# Patient Record
Sex: Female | Born: 1960 | Race: White | Hispanic: No | Marital: Single | Smoking: Former smoker
Health system: Southern US, Community
[De-identification: ages and names within clinical notes are randomized; demographics above are authoritative.]

## PROBLEM LIST (undated history)

## (undated) DIAGNOSIS — G43909 Migraine, unspecified, not intractable, without status migrainosus: Secondary | ICD-10-CM

## (undated) DIAGNOSIS — Z9889 Other specified postprocedural states: Secondary | ICD-10-CM

## (undated) DIAGNOSIS — R112 Nausea with vomiting, unspecified: Secondary | ICD-10-CM

## (undated) HISTORY — PX: FRACTURE SURGERY: SHX138

## (undated) HISTORY — PX: CHOLECYSTECTOMY: SHX55

## (undated) HISTORY — PX: APPENDECTOMY: SHX54

## (undated) HISTORY — PX: DIAGNOSTIC LAPAROSCOPY: SUR761

## (undated) HISTORY — DX: Migraine, unspecified, not intractable, without status migrainosus: G43.909

## (undated) HISTORY — PX: MULTIPLE TOOTH EXTRACTIONS: SHX2053

---

## 2008-02-01 ENCOUNTER — Emergency Department (HOSPITAL_COMMUNITY): Admission: EM | Admit: 2008-02-01 | Discharge: 2008-02-01 | Payer: Self-pay | Admitting: Emergency Medicine

## 2009-06-14 ENCOUNTER — Observation Stay (HOSPITAL_COMMUNITY): Admission: EM | Admit: 2009-06-14 | Discharge: 2009-06-15 | Payer: Self-pay | Admitting: Emergency Medicine

## 2010-08-02 LAB — BASIC METABOLIC PANEL
BUN: 11 mg/dL (ref 6–23)
BUN: 11 mg/dL (ref 6–23)
CO2: 28 mEq/L (ref 19–32)
Chloride: 105 mEq/L (ref 96–112)
Chloride: 108 mEq/L (ref 96–112)
GFR calc Af Amer: >60 mL/min (ref >60)
GFR calc non Af Amer: >60 mL/min (ref >60)
Sodium: 140 mEq/L (ref 135–145)

## 2010-08-02 LAB — CBC
HCT: 38.5 % (ref 36.0–46.0)
Hemoglobin: 11.6 g/dL — ABNORMAL LOW (ref 12.0–15.0)
Hemoglobin: 13.3 g/dL (ref 12.0–15.0)
MCHC: 34.5 g/dL (ref 30.0–36.0)
MCV: 93.9 fL (ref 78.0–100.0)
RBC: 3.64 MIL/uL — ABNORMAL LOW (ref 3.87–5.11)
RBC: 4.1 MIL/uL (ref 3.87–5.11)
RDW: 13.6 % (ref 11.5–15.5)
RDW: 14 % (ref 11.5–15.5)
WBC: 9.7 10*3/uL (ref 4.0–10.5)

## 2010-08-02 LAB — LIPASE, BLOOD: Lipase: 51 U/L (ref 11–59)

## 2010-08-02 LAB — DIFFERENTIAL
Basophils Absolute: 0.1 10*3/uL (ref 0.0–0.1)
Basophils Relative: 1 % (ref 0–1)
Eosinophils Absolute: 0.5 10*3/uL (ref 0.0–0.7)
Eosinophils Relative: 5 % (ref 0–5)
Monocytes Absolute: 0.6 10*3/uL (ref 0.1–1.0)

## 2010-08-02 LAB — LIPID PANEL
Cholesterol: 132 mg/dL (ref 0–200)
LDL Cholesterol: 88 mg/dL (ref 0–99)
Triglycerides: 47 mg/dL (ref <150)
VLDL: 9 mg/dL (ref 0–40)

## 2010-08-02 LAB — RAPID URINE DRUG SCREEN, HOSP PERFORMED
Amphetamines: NOT DETECTED
Barbiturates: NOT DETECTED
Benzodiazepines: NOT DETECTED
Tetrahydrocannabinol: NOT DETECTED

## 2010-08-02 LAB — COMPREHENSIVE METABOLIC PANEL
ALT: 11 U/L (ref 0–35)
AST: 20 U/L (ref 0–37)
BUN: 10 mg/dL (ref 6–23)
CO2: 24 mEq/L (ref 19–32)
Potassium: 4.1 mEq/L (ref 3.5–5.1)
Sodium: 138 mEq/L (ref 135–145)
Total Bilirubin: 0.4 mg/dL (ref 0.3–1.2)

## 2010-08-02 LAB — TROPONIN I: Troponin I: 0.03 ng/mL (ref 0.00–0.06)

## 2010-08-02 LAB — CK TOTAL AND CKMB (NOT AT ARMC)
CK, MB: 0.6 ng/mL (ref 0.3–4.0)
Total CK: 37 U/L (ref 7–177)
Total CK: 43 U/L (ref 7–177)

## 2010-08-02 LAB — POCT CARDIAC MARKERS: Troponin i, poc: <0.05 ng/mL (ref 0.00–0.09)

## 2011-03-12 IMAGING — CR DG CHEST 2V
2 series · 2 of 2 positions shown · non-contrast
Comparison: None.

CLINICAL DATA: 48-year-old female with chest pain, left arm and
shoulder pain.

CHEST - 2 VIEW

[w chest pa]
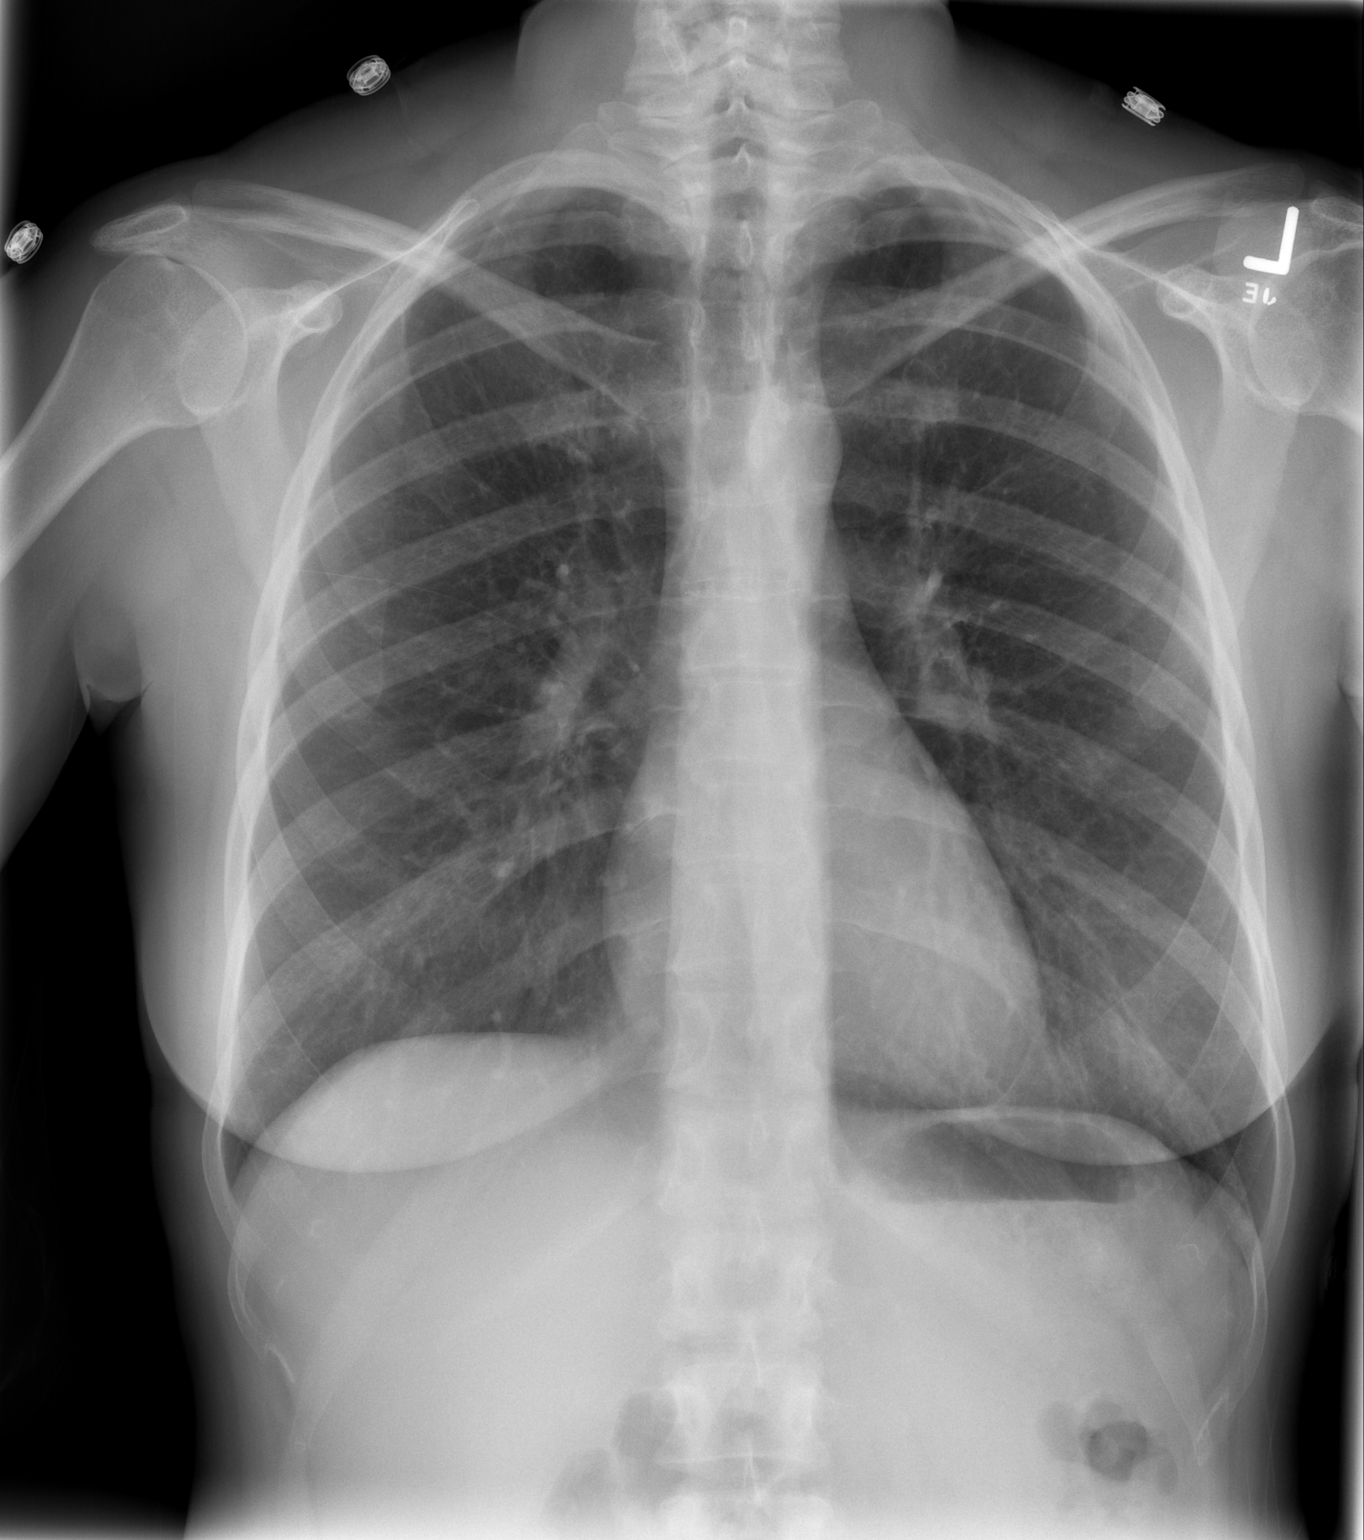

[w chest lat]
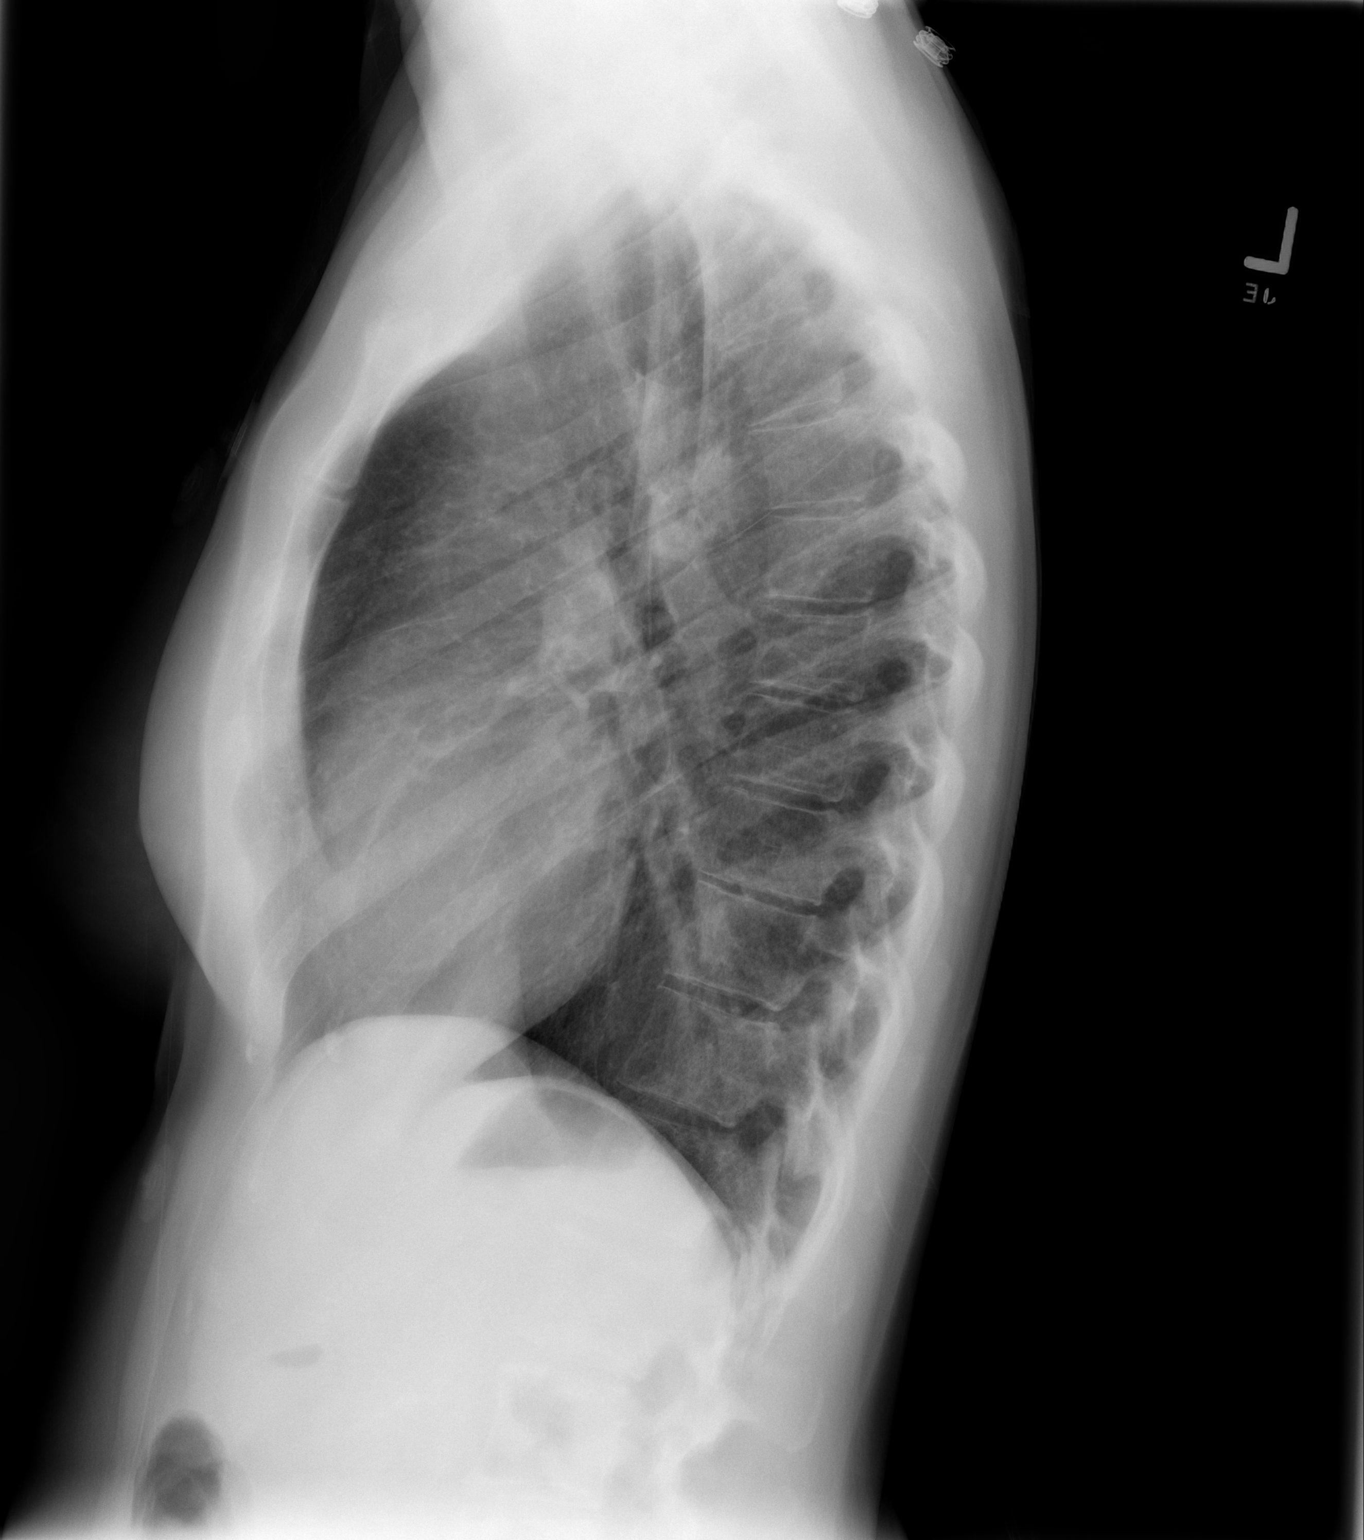

[2 of 2 positions shown; findings below may reference images not displayed]

FINDINGS: Lung volumes within normal limits.  Cardiac size and
mediastinal contours are within normal limits.  Visualized tracheal
air column is within normal limits.  Diffuse increased interstitial
markings, otherwise the lungs are clear with no pneumothorax or
pleural effusion. No acute osseous abnormality identified.
IMPRESSION: No acute cardiopulmonary abnormality.  Increased interstitial
markings probably reflect history of smoking.

## 2011-04-24 ENCOUNTER — Emergency Department (INDEPENDENT_AMBULATORY_CARE_PROVIDER_SITE_OTHER)
Admission: EM | Admit: 2011-04-24 | Discharge: 2011-04-24 | Disposition: A | Discharge status: Home / Self Care | Payer: Self-pay | Source: Home / Self Care | Attending: Family Medicine | Admitting: Family Medicine

## 2011-04-24 ENCOUNTER — Encounter: Payer: Self-pay | Admitting: Emergency Medicine

## 2011-04-24 ENCOUNTER — Emergency Department (INDEPENDENT_AMBULATORY_CARE_PROVIDER_SITE_OTHER): Payer: Self-pay

## 2011-04-24 DIAGNOSIS — S62639A Displaced fracture of distal phalanx of unspecified finger, initial encounter for closed fracture: Secondary | ICD-10-CM

## 2011-04-24 DIAGNOSIS — T07XXXA Unspecified multiple injuries, initial encounter: Secondary | ICD-10-CM

## 2011-04-24 DIAGNOSIS — IMO0002 Reserved for concepts with insufficient information to code with codable children: Secondary | ICD-10-CM

## 2011-04-24 MED ORDER — IBUPROFEN 800 MG PO TABS
ORAL_TABLET | ORAL | Status: AC
  Filled 2011-04-24: qty 1

## 2011-04-24 MED ORDER — IBUPROFEN 800 MG PO TABS
800.0000 mg | ORAL_TABLET | Freq: Once | ORAL | Status: AC
  Administered 2011-04-24: 800 mg via ORAL
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
## 2011-05-29 ENCOUNTER — Telehealth (HOSPITAL_COMMUNITY): Payer: Self-pay | Admitting: *Deleted

## 2011-05-30 ENCOUNTER — Other Ambulatory Visit: Payer: Self-pay | Admitting: Orthopedic Surgery

## 2011-06-02 ENCOUNTER — Encounter (HOSPITAL_BASED_OUTPATIENT_CLINIC_OR_DEPARTMENT_OTHER): Payer: Self-pay | Admitting: *Deleted
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
## 2011-06-03 ENCOUNTER — Encounter (HOSPITAL_BASED_OUTPATIENT_CLINIC_OR_DEPARTMENT_OTHER): Payer: Self-pay | Admitting: *Deleted

## 2011-06-03 ENCOUNTER — Ambulatory Visit (HOSPITAL_BASED_OUTPATIENT_CLINIC_OR_DEPARTMENT_OTHER): Payer: No Typology Code available for payment source | Admitting: *Deleted

## 2011-06-03 ENCOUNTER — Encounter (HOSPITAL_BASED_OUTPATIENT_CLINIC_OR_DEPARTMENT_OTHER)
Admission: RE | Disposition: A | Discharge status: Home / Self Care | Payer: Self-pay | Source: Ambulatory Visit | Attending: Orthopedic Surgery

## 2011-06-03 ENCOUNTER — Ambulatory Visit (HOSPITAL_BASED_OUTPATIENT_CLINIC_OR_DEPARTMENT_OTHER)
Admission: RE | Admit: 2011-06-03 | Discharge: 2011-06-03 | Disposition: A | Discharge status: Home / Self Care | Payer: No Typology Code available for payment source | Source: Ambulatory Visit | Attending: Orthopedic Surgery | Admitting: Orthopedic Surgery

## 2011-06-03 DIAGNOSIS — X500XXA Overexertion from strenuous movement or load, initial encounter: Secondary | ICD-10-CM | POA: Insufficient documentation

## 2011-06-03 DIAGNOSIS — S63639A Sprain of interphalangeal joint of unspecified finger, initial encounter: Secondary | ICD-10-CM | POA: Insufficient documentation

## 2011-06-03 HISTORY — DX: Other specified postprocedural states: R11.2

## 2011-06-03 HISTORY — PX: TENDON REPAIR: SHX5111

## 2011-06-03 HISTORY — DX: Other specified postprocedural states: Z98.890

## 2011-06-03 LAB — POCT HEMOGLOBIN-HEMACUE: Hemoglobin: 13.6 g/dL (ref 12.0–15.0)

## 2011-06-03 SURGERY — TENDON REPAIR
Anesthesia: General | Site: Finger | Laterality: Right | Wound class: Clean

## 2011-06-03 MED ORDER — HYDROMORPHONE HCL PF 1 MG/ML IJ SOLN
0.2500 mg | INTRAMUSCULAR | Status: DC | PRN
  Administered 2011-06-03: 0.25 mg via INTRAVENOUS

## 2011-06-03 MED ORDER — PROPOFOL 10 MG/ML IV EMUL
INTRAVENOUS | Status: DC | PRN
  Administered 2011-06-03: 200 mg via INTRAVENOUS

## 2011-06-03 MED ORDER — FENTANYL CITRATE 0.05 MG/ML IJ SOLN
INTRAMUSCULAR | Status: DC | PRN
  Administered 2011-06-03 (×2): 25 ug via INTRAVENOUS
  Administered 2011-06-03: 100 ug via INTRAVENOUS
  Administered 2011-06-03: 25 ug via INTRAVENOUS

## 2011-06-03 MED ORDER — MIDAZOLAM HCL 5 MG/5ML IJ SOLN
INTRAMUSCULAR | Status: DC | PRN
  Administered 2011-06-03: 2 mg via INTRAVENOUS

## 2011-06-03 MED ORDER — LACTATED RINGERS IV SOLN
INTRAVENOUS | Status: DC
Start: 2011-06-03 — End: 2011-06-03
  Administered 2011-06-03 (×3): via INTRAVENOUS

## 2011-06-03 MED ORDER — ONDANSETRON HCL 4 MG/2ML IJ SOLN
INTRAMUSCULAR | Status: DC | PRN
  Administered 2011-06-03: 4 mg via INTRAVENOUS

## 2011-06-03 MED ORDER — ONDANSETRON HCL 4 MG/2ML IJ SOLN
4.0000 mg | Freq: Four times a day (QID) | INTRAMUSCULAR | Status: AC | PRN
  Administered 2011-06-03: 4 mg via INTRAVENOUS

## 2011-06-03 MED ORDER — PROMETHAZINE HCL 25 MG/ML IJ SOLN
6.2500 mg | INTRAMUSCULAR | Status: DC | PRN
  Administered 2011-06-03: 6.25 mg via INTRAVENOUS

## 2011-06-03 MED ORDER — OXYCODONE-ACETAMINOPHEN 5-325 MG PO TABS: ORAL_TABLET | ORAL | Status: AC

## 2011-06-03 MED ORDER — LIDOCAINE HCL (CARDIAC) 20 MG/ML IV SOLN
INTRAVENOUS | Status: DC | PRN
  Administered 2011-06-03: 60 mg via INTRAVENOUS

## 2011-06-03 MED ORDER — DEXAMETHASONE SODIUM PHOSPHATE 10 MG/ML IJ SOLN
INTRAMUSCULAR | Status: DC | PRN
  Administered 2011-06-03: 10 mg via INTRAVENOUS

## 2011-06-03 MED ORDER — BUPIVACAINE HCL (PF) 0.25 % IJ SOLN
INTRAMUSCULAR | Status: DC | PRN
  Administered 2011-06-03: 8 mL

## 2011-06-03 SURGICAL SUPPLY — 81 items
BAG DECANTER FOR FLEXI CONT (MISCELLANEOUS) IMPLANT
BANDAGE CONFORM 2  STR LF (GAUZE/BANDAGES/DRESSINGS) IMPLANT
BANDAGE ELASTIC 3 VELCRO ST LF (GAUZE/BANDAGES/DRESSINGS) ×2 IMPLANT
BANDAGE GAUZE ELAST BULKY 4 IN (GAUZE/BANDAGES/DRESSINGS) ×2 IMPLANT
BLADE MINI RND TIP GREEN BEAV (BLADE) IMPLANT
BLADE SURG 15 STRL LF DISP TIS (BLADE) ×2 IMPLANT
BLADE SURG 15 STRL SS (BLADE) ×2
BNDG ELASTIC 2 VLCR STRL LF (GAUZE/BANDAGES/DRESSINGS) IMPLANT
BNDG ESMARK 4X9 LF (GAUZE/BANDAGES/DRESSINGS) ×2 IMPLANT
CHLORAPREP W/TINT 26ML (MISCELLANEOUS) ×2 IMPLANT
CLOTH BEACON ORANGE TIMEOUT ST (SAFETY) ×2 IMPLANT
CORDS BIPOLAR (ELECTRODE) ×2 IMPLANT
COTTONBALL LRG STERILE PKG (GAUZE/BANDAGES/DRESSINGS) IMPLANT
COVER MAYO STAND STRL (DRAPES) ×2 IMPLANT
COVER TABLE BACK 60X90 (DRAPES) ×2 IMPLANT
CUFF TOURNIQUET SINGLE 18IN (TOURNIQUET CUFF) ×2 IMPLANT
DECANTER SPIKE VIAL GLASS SM (MISCELLANEOUS) IMPLANT
DRAIN TLS ROUND 10FR (DRAIN) IMPLANT
DRAPE EXTREMITY T 121X128X90 (DRAPE) ×2 IMPLANT
DRAPE OEC MINIVIEW 54X84 (DRAPES) IMPLANT
DRAPE SURG 17X23 STRL (DRAPES) ×2 IMPLANT
DRSG PAD ABDOMINAL 8X10 ST (GAUZE/BANDAGES/DRESSINGS) IMPLANT
GAUZE SPONGE 4X4 16PLY XRAY LF (GAUZE/BANDAGES/DRESSINGS) IMPLANT
GAUZE XEROFORM 1X8 LF (GAUZE/BANDAGES/DRESSINGS) ×2 IMPLANT
GLOVE BIO SURGEON STRL SZ7.5 (GLOVE) ×2 IMPLANT
GLOVE BIOGEL PI IND STRL 8 (GLOVE) ×2 IMPLANT
GLOVE BIOGEL PI INDICATOR 8 (GLOVE) ×2
GLOVE SURG ORTHO 8.0 STRL STRW (GLOVE) ×2 IMPLANT
GOWN PREVENTION PLUS XLARGE (GOWN DISPOSABLE) ×2 IMPLANT
GOWN PREVENTION PLUS XXLARGE (GOWN DISPOSABLE) IMPLANT
GOWN STRL REIN XL XLG (GOWN DISPOSABLE) ×4 IMPLANT
KWIRE 4.0 X .035IN (WIRE) IMPLANT
LOOP VESSEL MAXI BLUE (MISCELLANEOUS) IMPLANT
NEEDLE HYPO 22GX1.5 SAFETY (NEEDLE) IMPLANT
NEEDLE HYPO 25X1 1.5 SAFETY (NEEDLE) ×4 IMPLANT
NEEDLE KEITH (NEEDLE) IMPLANT
NS IRRIG 1000ML POUR BTL (IV SOLUTION) ×2 IMPLANT
PACK BASIN DAY SURGERY FS (CUSTOM PROCEDURE TRAY) ×2 IMPLANT
PAD CAST 3X4 CTTN HI CHSV (CAST SUPPLIES) ×1 IMPLANT
PAD CAST 4YDX4 CTTN HI CHSV (CAST SUPPLIES) IMPLANT
PADDING CAST ABS 3INX4YD NS (CAST SUPPLIES)
PADDING CAST ABS 4INX4YD NS (CAST SUPPLIES) ×1
PADDING CAST ABS COTTON 3X4 (CAST SUPPLIES) IMPLANT
PADDING CAST ABS COTTON 4X4 ST (CAST SUPPLIES) ×1 IMPLANT
PADDING CAST COTTON 3X4 STRL (CAST SUPPLIES) ×1
PADDING CAST COTTON 4X4 STRL (CAST SUPPLIES)
SLEEVE SCD COMPRESS KNEE MED (MISCELLANEOUS) ×2 IMPLANT
SPLINT PLASTER CAST XFAST 3X15 (CAST SUPPLIES) ×10 IMPLANT
SPLINT PLASTER CAST XFAST 4X15 (CAST SUPPLIES) IMPLANT
SPLINT PLASTER XTRA FAST SET 4 (CAST SUPPLIES)
SPLINT PLASTER XTRA FASTSET 3X (CAST SUPPLIES) ×10
SPONGE GAUZE 4X4 12PLY (GAUZE/BANDAGES/DRESSINGS) ×2 IMPLANT
STOCKINETTE 4X48 STRL (DRAPES) ×2 IMPLANT
SUT CHROMIC 5 0 P 3 (SUTURE) IMPLANT
SUT ETHIBOND 3-0 V-5 (SUTURE) IMPLANT
SUT ETHILON 3 0 PS 1 (SUTURE) IMPLANT
SUT ETHILON 4 0 PS 2 18 (SUTURE) ×4 IMPLANT
SUT FIBERWIRE 3-0 18 TAPR NDL (SUTURE)
SUT FIBERWIRE 4-0 18 DIAM BLUE (SUTURE)
SUT MERSILENE 2.0 SH NDLE (SUTURE) IMPLANT
SUT MERSILENE 3 0 FS 1 (SUTURE) IMPLANT
SUT MERSILENE 4 0 P 3 (SUTURE) IMPLANT
SUT POLY BUTTON 15MM (SUTURE) ×2 IMPLANT
SUT PROLENE 2 0 SH DA (SUTURE) ×2 IMPLANT
SUT PROLENE 6 0 P 1 18 (SUTURE) IMPLANT
SUT SILK 2 0 FS (SUTURE) ×2 IMPLANT
SUT SILK 4 0 PS 2 (SUTURE) IMPLANT
SUT STEEL 4 0 V 26 (SUTURE) IMPLANT
SUT VIC AB 3-0 PS1 18 (SUTURE)
SUT VIC AB 3-0 PS1 18XBRD (SUTURE) IMPLANT
SUT VIC AB 4-0 P-3 18XBRD (SUTURE) IMPLANT
SUT VIC AB 4-0 P3 18 (SUTURE)
SUT VICRYL 4-0 PS2 18IN ABS (SUTURE) IMPLANT
SUTURE FIBERWR 3-0 18 TAPR NDL (SUTURE) IMPLANT
SUTURE FIBERWR 4-0 18 DIA BLUE (SUTURE) IMPLANT
SYR BULB 3OZ (MISCELLANEOUS) ×2 IMPLANT
SYR CONTROL 10ML LL (SYRINGE) ×2 IMPLANT
TOWEL OR 17X24 6PK STRL BLUE (TOWEL DISPOSABLE) ×4 IMPLANT
TUBE FEEDING 5FR 15 INCH (TUBING) ×2 IMPLANT
UNDERPAD 30X30 INCONTINENT (UNDERPADS AND DIAPERS) ×2 IMPLANT
WATER STERILE IRR 1000ML POUR (IV SOLUTION) IMPLANT

## 2011-06-03 NOTE — Anesthesia Postprocedure Evaluation (Signed)
Anesthesia Post Note  Patient: Megan Li  Procedure(s) Performed:  TENDON REPAIR - right small flexor tendon repair  Anesthesia type: General  Patient location: PACU  Post pain: Pain level controlled and Adequate analgesia  Post assessment: Post-op Vital signs reviewed, Patient's Cardiovascular Status Stable, Respiratory Function Stable, Patent Airway and Pain level controlled  Last Vitals:  Filed Vitals:   06/03/11 1450  BP:   Pulse: 85  Temp:   Resp: 11    Post vital signs: Reviewed and stable  Level of consciousness: awake, alert  and oriented  Complications: No apparent anesthesia complications

## 2011-06-03 NOTE — Op Note (Signed)
Dictation [phone]

## 2011-06-03 NOTE — Anesthesia Preprocedure Evaluation (Signed)
Anesthesia Evaluation  Patient identified by MRN, date of birth, ID band Patient awake    Reviewed: Allergy & Precautions, H&P , NPO status , Patient's Chart, lab work & pertinent test results  History of Anesthesia Complications (+) PONV  Airway Mallampati: II  Neck ROM: full    Dental   Pulmonary          Cardiovascular     Neuro/Psych    GI/Hepatic   Endo/Other    Renal/GU      Musculoskeletal   Abdominal   Peds  Hematology   Anesthesia Other Findings   Reproductive/Obstetrics                           Anesthesia Physical Anesthesia Plan  ASA: II  Anesthesia Plan: General   Post-op Pain Management:    Induction: Intravenous  Airway Management Planned: LMA  Additional Equipment:   Intra-op Plan:   Post-operative Plan:   Informed Consent: I have reviewed the patients History and Physical, chart, labs and discussed the procedure including the risks, benefits and alternatives for the proposed anesthesia with the patient or authorized representative who has indicated his/her understanding and acceptance.     Plan Discussed with: CRNA and Surgeon  Anesthesia Plan Comments:         Anesthesia Quick Evaluation

## 2011-06-03 NOTE — Transfer of Care (Signed)
Immediate Anesthesia Transfer of Care Note  Patient: Megan Li  Procedure(s) Performed:  TENDON REPAIR - right small flexor tendon repair  Patient Location: PACU  Anesthesia Type: General  Level of Consciousness: awake, alert , oriented and patient cooperative  Airway & Oxygen Therapy: Patient Spontanous Breathing and Patient connected to face mask oxygen  Post-op Assessment: Report given to PACU RN and Post -op Vital signs reviewed and stable  Post vital signs: Reviewed and stable  Complications: No apparent anesthesia complications

## 2011-06-03 NOTE — Anesthesia Procedure Notes (Signed)
Procedure Name: LMA Insertion Date/Time: 06/03/2011 1:45 PM Performed by: Matilde Pottenger D Pre-anesthesia Checklist: Patient identified, Emergency Drugs available, Suction available and Patient being monitored Patient Re-evaluated:Patient Re-evaluated prior to inductionOxygen Delivery Method: Circle System Utilized Preoxygenation: Pre-oxygenation with 100% oxygen Intubation Type: IV induction Ventilation: Mask ventilation without difficulty LMA: LMA inserted LMA Size: 4.0 Number of attempts: 1 Placement Confirmation: positive ETCO2 Tube secured with: Tape Dental Injury: Teeth and Oropharynx as per pre-operative assessment

## 2011-06-03 NOTE — Progress Notes (Signed)
Reported to L. Glendell Docker, RN as caregiver
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
## 2011-06-03 NOTE — Brief Op Note (Signed)
06/03/2011  2:45 PM  PATIENT:  Megan Li  51 y.o. female  PRE-OPERATIVE DIAGNOSIS:  right small flexor tendon avulsion  POST-OPERATIVE DIAGNOSIS:  right small flexor tendon avulsion  PROCEDURE:  Procedure(s): TENDON REPAIR  SURGEON:  Surgeon(s): Tami Ribas, MD Nicki Reaper, MD  PHYSICIAN ASSISTANT:   ASSISTANTS: none   ANESTHESIA:   general  EBL:  Total I/O In: 1600 [I.V.:1600] Out: -   BLOOD ADMINISTERED:none  DRAINS: none   LOCAL MEDICATIONS USED:  MARCAINE 8 CC  SPECIMEN:  No Specimen  DISPOSITION OF SPECIMEN:  N/A  COUNTS:  YES  TOURNIQUET:   Total Tourniquet Time Documented: Upper Arm (Right) - 45 minutes  DICTATION: .Other Dictation: Dictation Number [id number]  PLAN OF CARE: Discharge to home after PACU  PATIENT DISPOSITION:  PACU - hemodynamically stable.

## 2011-06-04 ENCOUNTER — Encounter (HOSPITAL_BASED_OUTPATIENT_CLINIC_OR_DEPARTMENT_OTHER): Payer: Self-pay | Admitting: Orthopedic Surgery
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
## 2011-08-01 ENCOUNTER — Other Ambulatory Visit: Payer: Self-pay | Admitting: Obstetrics and Gynecology

## 2011-08-01 DIAGNOSIS — Z1231 Encounter for screening mammogram for malignant neoplasm of breast: Secondary | ICD-10-CM

## 2011-08-12 ENCOUNTER — Ambulatory Visit (INDEPENDENT_AMBULATORY_CARE_PROVIDER_SITE_OTHER): Payer: No Typology Code available for payment source | Admitting: *Deleted

## 2011-08-12 ENCOUNTER — Ambulatory Visit (HOSPITAL_COMMUNITY)
Admission: RE | Admit: 2011-08-12 | Discharge: 2011-08-12 | Disposition: A | Discharge status: Home / Self Care | Payer: Self-pay | Source: Ambulatory Visit | Attending: Obstetrics and Gynecology | Admitting: Obstetrics and Gynecology

## 2011-08-12 VITALS — BP 134/87 | HR 76 | Temp 98.0°F | Ht 65.0 in | Wt 164.1 lb

## 2011-08-12 DIAGNOSIS — Z1231 Encounter for screening mammogram for malignant neoplasm of breast: Secondary | ICD-10-CM

## 2011-08-12 DIAGNOSIS — Z1239 Encounter for other screening for malignant neoplasm of breast: Secondary | ICD-10-CM

## 2011-08-12 NOTE — Patient Instructions (Signed)
Taught patient how to perform BSE. Patient did not need a Pap smear today due to last Pap smear was 07/07/11. Let her know [facility] will cover Pap smears every 3 years unless has a history of abnormal Pap smears. Patient is escorted to mammography for a screening mammogram. Let patient know will follow up with her within the next couple weeks with results. Patient verbalized understanding.

## 2011-08-12 NOTE — Progress Notes (Signed)
Complaints of white pimple on right nipple and occasional milky discharge from bilateral breasts last noticed 7 months ago.  Pap Smear:    Pap smear not performed today. Patients last Pap smear was 07/07/11 at the free Pap smear screening at the [facility] and awaiting results. Per patient no history of abnormal Pap smears. No Pap smear results in EPIC.  Physical exam: Breasts Breasts symmetrical. No skin abnormalities left breast. Small white lesion on right nipple that has a pimple appearance. No redness observed around white lesion. No nipple retraction bilateral breasts. Patient complained of milky discharge of bilateral breasts that happens when aroused. Last time patient observed any nipple discharge was 7 months ago. No discharge excreted when checked for nipple discharge. Patient attempted to squeeze nipple discharge to get sample for cytology and unable to get any discharge. No lymphadenopathy. No lumps palpated bilateral breasts. No complaints of pain or tenderness on exam.         Pelvic/Bimanual No Pap smear completed today since last Pap smear was 07/07/11. Pap smear not indicated per [facility] guidelines.

## 2011-09-01 ENCOUNTER — Encounter: Payer: Self-pay | Admitting: Obstetrics and Gynecology

## 2011-09-17 ENCOUNTER — Encounter: Payer: Self-pay | Admitting: Obstetrics and Gynecology

## 2012-10-11 ENCOUNTER — Other Ambulatory Visit: Payer: Self-pay | Admitting: Obstetrics and Gynecology

## 2012-10-11 DIAGNOSIS — Z1231 Encounter for screening mammogram for malignant neoplasm of breast: Secondary | ICD-10-CM

## 2012-10-26 ENCOUNTER — Ambulatory Visit (HOSPITAL_COMMUNITY): Payer: Self-pay

## 2013-01-20 IMAGING — CR DG HAND COMPLETE 3+V*R*
3 series · 3 of 3 positions shown · non-contrast
Comparison: None.

CLINICAL DATA: Pain after fall

RIGHT HAND - COMPLETE 3+ VIEW

[view not recorded (1 of 3)]
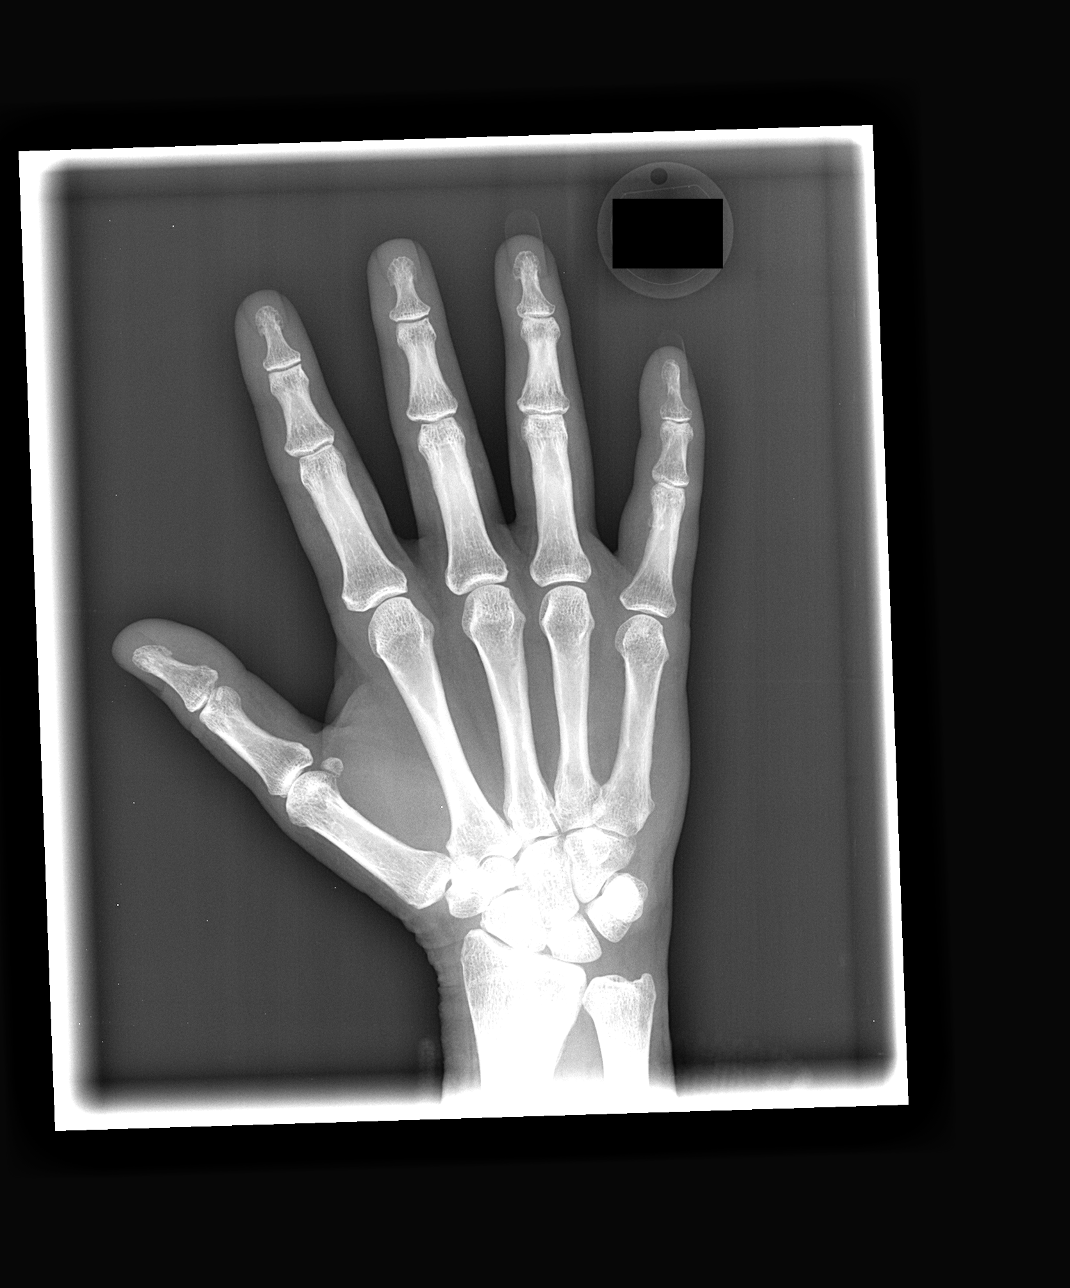

[view not recorded (2 of 3)]
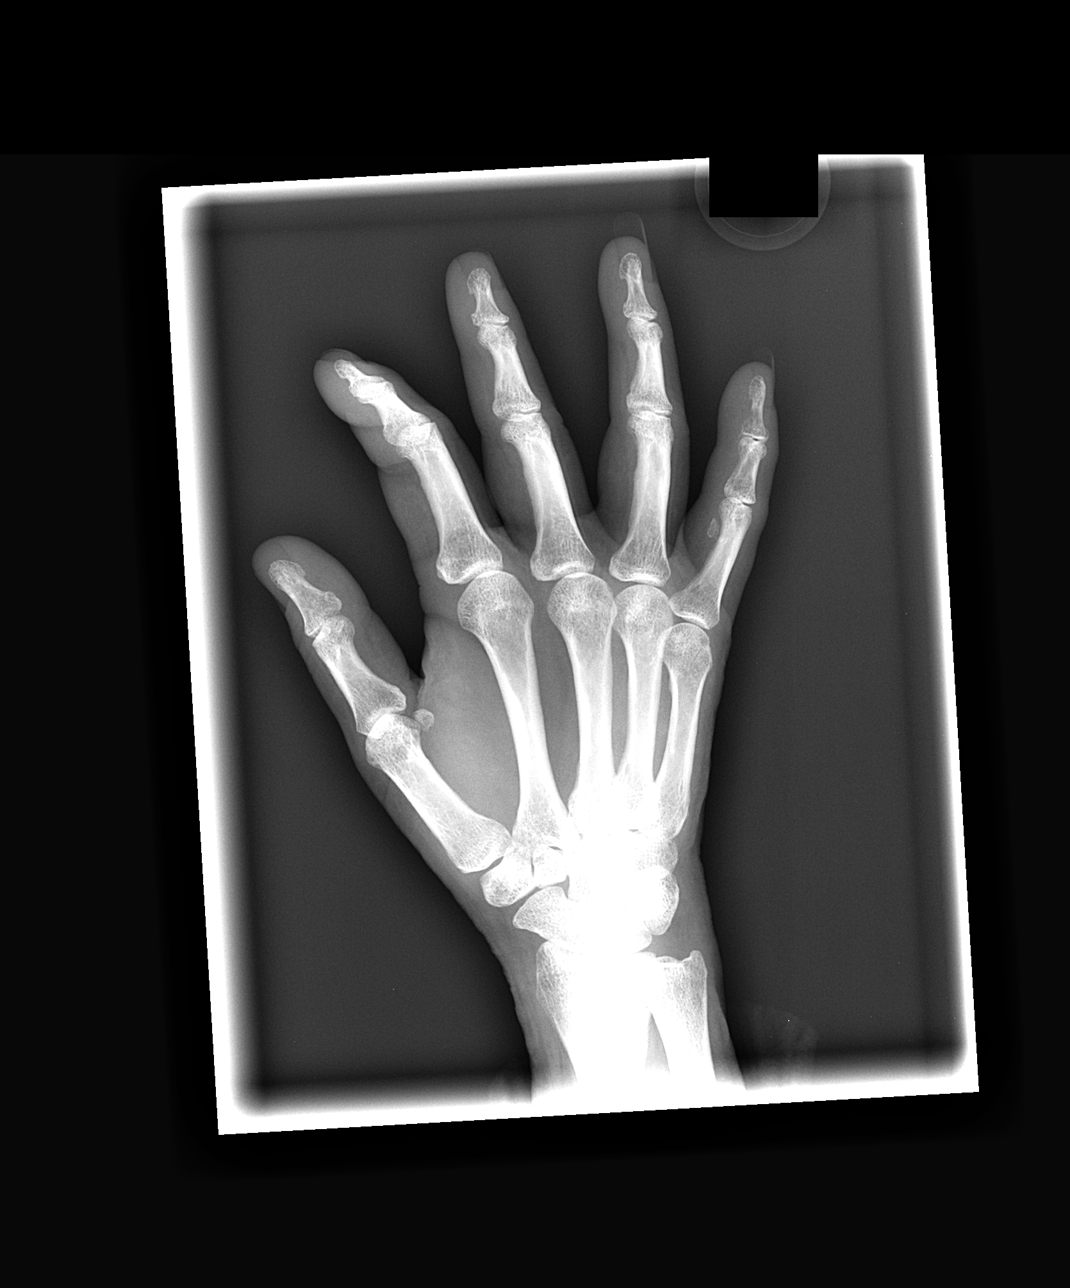

[view not recorded (3 of 3)]
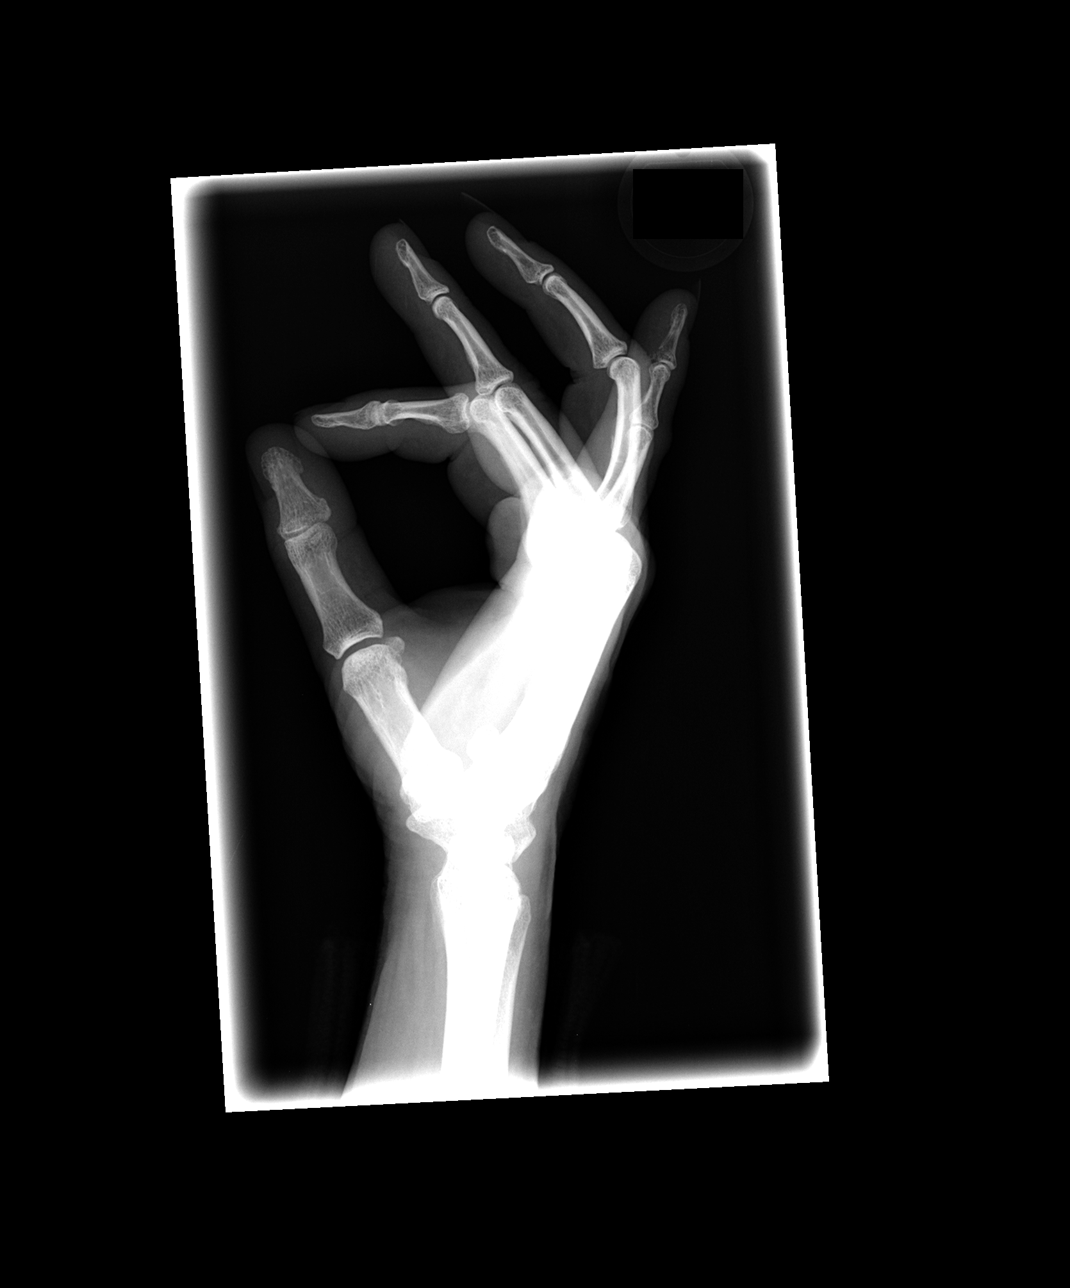

[3 of 3 positions shown; findings below may reference images not displayed]

FINDINGS: There is a fracture at the palmar base of the fifth
distal phalanx which extends into the DIP joint.  This is seen on
the lateral film only.

There is a density adjacent to the distal shaft of the fifth
proximal phalanx which is well corticated and felt to be an ossicle
or an old avulsion injury; however, please correlate clinically
regarding an acute fracture in this location.

There is soft tissue swelling overlying the fifth finger.
IMPRESSION: Small nondisplaced fracture at the base of the fifth distal
phalanx.

Probable ossicle or old avulsion injury adjacent to the distal
shaft of the fifth proximal phalanx.

## 2015-01-15 ENCOUNTER — Encounter (HOSPITAL_COMMUNITY): Payer: Self-pay | Admitting: Emergency Medicine

## 2015-01-15 ENCOUNTER — Emergency Department (INDEPENDENT_AMBULATORY_CARE_PROVIDER_SITE_OTHER)
Admission: EM | Admit: 2015-01-15 | Discharge: 2015-01-15 | Disposition: A | Discharge status: Home / Self Care | Payer: 59 | Source: Home / Self Care | Attending: Family Medicine | Admitting: Family Medicine

## 2015-01-15 DIAGNOSIS — J029 Acute pharyngitis, unspecified: Secondary | ICD-10-CM

## 2015-01-15 DIAGNOSIS — J0100 Acute maxillary sinusitis, unspecified: Secondary | ICD-10-CM

## 2015-01-15 DIAGNOSIS — R51 Headache: Secondary | ICD-10-CM | POA: Diagnosis not present

## 2015-01-15 DIAGNOSIS — H1013 Acute atopic conjunctivitis, bilateral: Secondary | ICD-10-CM | POA: Diagnosis not present

## 2015-01-15 DIAGNOSIS — R519 Headache, unspecified: Secondary | ICD-10-CM

## 2015-01-15 DIAGNOSIS — R0982 Postnasal drip: Secondary | ICD-10-CM

## 2015-01-15 MED ORDER — PREDNISONE 20 MG PO TABS: ORAL_TABLET | ORAL | Status: AC

## 2015-01-15 NOTE — ED Notes (Signed)
Pt states she was feeling a lot of congestion in her sinuses and so she was directed to take Sudafed her pharmacist.  Pt states that since then, she has started feeling a lot of pressure in her face and has been suffering from facial swelling and numbness all over.  She also reports fullness in her ears.  She denies any fever at home.

## 2015-01-15 NOTE — ED Provider Notes (Signed)
CSN: [id number]     Arrival date & time 01/15/15  1414 History   First MD Initiated Contact with Patient 01/15/15 1450     Chief Complaint  Patient presents with  . Sinusitis   (Consider location/radiation/quality/duration/timing/severity/associated sxs/prior Treatment) HPI Comments: 54 year old female complaining of sinus congestion for 3-4 days. She went to the drugstore and the pharmacist had recommended Sudafed. She is taking that with good relief of nasal congestion. Denies rhinorrhea. The initial onset of symptoms including PND and hoarseness. She is now complaining of facial swelling that began this morning. She awoke with her the orbital swelling as well as swelling over the forehead and anterior face. She is complaining of headache and facial pain described as pressure and numbing feeling. She has no known history of sinusitis. Denies fevers. Denies chills. Denies sore throat. Cough is rare.  Patient is a 54 y.o. female presenting with URI.  URI Presenting symptoms: congestion and facial pain   Presenting symptoms: no cough, no ear pain, no fatigue, no fever, no rhinorrhea and no sore throat   Severity:  Moderate Onset quality:  Sudden Duration:  4 days Timing:  Constant Progression:  Worsening Chronicity:  New Relieved by:  OTC medications Worsened by:  Certain positions Associated symptoms: headaches and sinus pain   Associated symptoms: no arthralgias, no myalgias, no neck pain, no sneezing, no swollen glands and no wheezing   Risk factors: no chronic cardiac disease and no diabetes mellitus     Past Medical History  Diagnosis Date  . PONV (postoperative nausea and vomiting)   . Migraines    Past Surgical History  Procedure Laterality Date  . Cholecystectomy    . Appendectomy    . Fracture surgery      rt lower ankle-  . Diagnostic laparoscopy      abd-lymph nodes and tumor  . Cesarean section    . Multiple tooth extractions    . Tendon repair  06/03/2011     Procedure: TENDON REPAIR;  Surgeon: Tami Ribas, MD;  Location: [facility];  Service: Orthopedics;  Laterality: Right;  right small flexor tendon repair   Family History  Problem Relation Age of Onset  . Hypertension Mother   . Hypertension Sister   . Breast cancer Sister   . Cancer Sister     brain   Social History  Substance Use Topics  . Smoking status: Former Smoker    Quit date: 06/01/2010  . Smokeless tobacco: None  . Alcohol Use: No   OB History    Gravida Para Term Preterm AB TAB SAB Ectopic Multiple Living   8    7 1 6   2      Review of Systems  Constitutional: Positive for activity change. Negative for fever, chills, appetite change and fatigue.  HENT: Positive for congestion, facial swelling and postnasal drip. Negative for ear pain, rhinorrhea, sneezing and sore throat.   Eyes:       Swelling around the eyes.  Respiratory: Negative.  Negative for cough, chest tightness and wheezing.   Cardiovascular: Negative.   Gastrointestinal: Negative.   Musculoskeletal: Negative for myalgias, arthralgias, neck pain and neck stiffness.  Skin: Negative for pallor and rash.  Neurological: Positive for headaches. Negative for dizziness.  Psychiatric/Behavioral: Negative for behavioral problems and agitation.  All other systems reviewed and are negative.   Allergies  Morphine and related  Home Medications   Prior to Admission medications   Medication Sig Start Date End Date  Taking? Authorizing Provider  pseudoephedrine (SUDAFED) 30 MG tablet Take 30 mg by mouth every 4 (four) hours as needed for congestion.   Yes Historical Provider, MD  aspirin-acetaminophen-caffeine (EXCEDRIN MIGRAINE) [phone] MG per tablet Take 1 tablet by mouth every 6 (six) hours as needed.    Historical Provider, MD  lansoprazole (PREVACID) 15 MG capsule Take 15 mg by mouth daily.    Historical Provider, MD  NON FORMULARY otc migraine med    Historical Provider, MD   oxyCODONE-acetaminophen (PERCOCET) 5-325 MG per tablet Take 1 tablet by mouth every 4 (four) hours as needed.    Historical Provider, MD  predniSONE (DELTASONE) 20 MG tablet Take 3 tabs po on first day, 2 tabs second day, 2 tabs third day, 1 tab fourth day, 1 tab 5th day. Take with food. 01/15/15   Hayden Rasmussen, NP   Meds Ordered and Administered this Visit  Medications - No data to display  BP 148/85 mmHg  Pulse 73  Temp(Src) 98.4 F (36.9 C) (Oral)  SpO2 98% No data found.   Physical Exam  Constitutional: She is oriented to person, place, and time. She appears well-developed and well-nourished. No distress.  HENT:  Head: Normocephalic and atraumatic.  Bilateral TMs are mildly retracted. Oropharynx with minor erythema and much cobblestoning. Positive for clear PND. Positive for facial puffiness/swelling. Tenderness over the paranasal, maxillary and forehead sinuses There is also tenderness over the temple musculature and parietal musculature.  Eyes: Conjunctivae and EOM are normal.  Neck: Normal range of motion. Neck supple.  Cardiovascular: Normal rate, regular rhythm and normal heart sounds.   Pulmonary/Chest: Effort normal and breath sounds normal. No respiratory distress. She has no wheezes. She has no rales.  Abdominal: Soft. There is no tenderness.  Musculoskeletal: Normal range of motion. She exhibits no edema.  Lymphadenopathy:    She has no cervical adenopathy.  Neurological: She is alert and oriented to person, place, and time.  Skin: Skin is warm and dry. No rash noted.  Psychiatric: She has a normal mood and affect.  Nursing note and vitals reviewed.   ED Course  Procedures (including critical care time)  Labs Review Labs Reviewed - No data to display  Imaging Review No results found.   Visual Acuity Review  Right Eye Distance:   Left Eye Distance:   Bilateral Distance:    Right Eye Near:   Left Eye Near:    Bilateral Near:         MDM   1.  Acute maxillary sinusitis, recurrence not specified   2. Facial pain   3. Allergic conjunctivitis, bilateral   4. Allergic pharyngitis   5. PND (post-nasal drip)    COntinue the Sudafed as directed Lots of Saline nasal spray Claritin or Allegra for drainage and allergies Prednisone as directed, take with food Warm compresses to eyes zaditor eye drops as directed Lots of fluids  Tylenol prn   Hayden Rasmussen, NP 01/15/15 1547

## 2015-01-15 NOTE — Discharge Instructions (Signed)
Allergic Conjunctivitis Zaditor eye drops 1 drop each eye twice a day A thin membrane (conjunctiva) covers the eyeball and underside of the eyelids. Allergic conjunctivitis happens when the thin membrane gets irritated from things like animal dander, pollen, perfumes, or smoke (allergens). The membrane may become puffy (swollen) and red. Small bumps may form on the inside of the eyelids. Your eyes may get teary, itchy, or burn. It cannot be passed to another person (contagious).  HOME CARE  Wash your hands before and after applying medicated drops or creams.  Do not touch the drop or cream tube to your eye or eyelids.  Do not use your soft contacts. Throw them away. Use a new pair once recovery is complete.  Do not use your hard contacts. They need to be washed (sterilized) thoroughly after recovery is complete.  Put a cold cloth to your eye(s) if you have itching and burning. GET HELP RIGHT AWAY IF:   You are not feeling better in 2 to 3 days after treatment.  Your lids are sticky or stick together.  Fluid comes from the eye(s).  You become sensitive to light.  You have a temperature by mouth above 102 F (38.9 C).  You have pain in and around the eye(s).  You start to have vision problems. MAKE SURE YOU:   Understand these instructions.  Will watch your condition.  Will get help right away if you are not doing well or get worse. Document Released: 10/16/2009 Document Revised: 07/21/2011 Document Reviewed: 10/16/2009 [facility] Patient Information 2015 [place], [place]. This information is not intended to replace advice given to you by your health care provider. Make sure you discuss any questions you have with your health care provider.  Sinusitis COntinue the Sudafed as directed Lots of Saline nasal spray Claritin or Allegra for drainage and allergies Prednisone as directed, take with food Warm compresses to eyes  Sinusitis is redness, soreness, and inflammation of the  paranasal sinuses. Paranasal sinuses are air pockets within the bones of your face (beneath the eyes, the middle of the forehead, or above the eyes). In healthy paranasal sinuses, mucus is able to drain out, and air is able to circulate through them by way of your nose. However, when your paranasal sinuses are inflamed, mucus and air can become trapped. This can allow bacteria and other germs to grow and cause infection. Sinusitis can develop quickly and last only a short time (acute) or continue over a long period (chronic). Sinusitis that lasts for more than 12 weeks is considered chronic.  CAUSES  Causes of sinusitis include:  Allergies.  Structural abnormalities, such as displacement of the cartilage that separates your nostrils (deviated septum), which can decrease the air flow through your nose and sinuses and affect sinus drainage.  Functional abnormalities, such as when the small hairs (cilia) that line your sinuses and help remove mucus do not work properly or are not present. SIGNS AND SYMPTOMS  Symptoms of acute and chronic sinusitis are the same. The primary symptoms are pain and pressure around the affected sinuses. Other symptoms include:  Upper toothache.  Earache.  Headache.  Bad breath.  Decreased sense of smell and taste.  A cough, which worsens when you are lying flat.  Fatigue.  Fever.  Thick drainage from your nose, which often is green and may contain pus (purulent).  Swelling and warmth over the affected sinuses. DIAGNOSIS  Your health care provider will perform a physical exam. During the exam, your health care provider  may:  Look in your nose for signs of abnormal growths in your nostrils (nasal polyps).  Tap over the affected sinus to check for signs of infection.  View the inside of your sinuses (endoscopy) using an imaging device that has a light attached (endoscope). If your health care provider suspects that you have chronic sinusitis, one or more  of the following tests may be recommended:  Allergy tests.  Nasal culture. A sample of mucus is taken from your nose, sent to a lab, and screened for bacteria.  Nasal cytology. A sample of mucus is taken from your nose and examined by your health care provider to determine if your sinusitis is related to an allergy. TREATMENT  Most cases of acute sinusitis are related to a viral infection and will resolve on their own within 10 days. Sometimes medicines are prescribed to help relieve symptoms (pain medicine, decongestants, nasal steroid sprays, or saline sprays).  However, for sinusitis related to a bacterial infection, your health care provider will prescribe antibiotic medicines. These are medicines that will help kill the bacteria causing the infection.  Rarely, sinusitis is caused by a fungal infection. In theses cases, your health care provider will prescribe antifungal medicine. For some cases of chronic sinusitis, surgery is needed. Generally, these are cases in which sinusitis recurs more than 3 times per year, despite other treatments. HOME CARE INSTRUCTIONS   Drink plenty of water. Water helps thin the mucus so your sinuses can drain more easily.  Use a humidifier.  Inhale steam 3 to 4 times a day (for example, sit in the bathroom with the shower running).  Apply a warm, moist washcloth to your face 3 to 4 times a day, or as directed by your health care provider.  Use saline nasal sprays to help moisten and clean your sinuses.  Take medicines only as directed by your health care provider.  If you were prescribed either an antibiotic or antifungal medicine, finish it all even if you start to feel better. SEEK IMMEDIATE MEDICAL CARE IF:  You have increasing pain or severe headaches.  You have nausea, vomiting, or drowsiness.  You have swelling around your face.  You have vision problems.  You have a stiff neck.  You have difficulty breathing. MAKE SURE YOU:    Understand these instructions.  Will watch your condition.  Will get help right away if you are not doing well or get worse. Document Released: 04/28/2005 Document Revised: 09/12/2013 Document Reviewed: 05/13/2011 [facility] Patient Information 2015 [place], [place]. This information is not intended to replace advice given to you by your health care provider. Make sure you discuss any questions you have with your health care provider.

## 2015-05-23 ENCOUNTER — Other Ambulatory Visit: Payer: Self-pay | Admitting: Internal Medicine

## 2015-05-23 DIAGNOSIS — R1084 Generalized abdominal pain: Secondary | ICD-10-CM

## 2015-05-23 DIAGNOSIS — R1011 Right upper quadrant pain: Secondary | ICD-10-CM

## 2015-05-29 ENCOUNTER — Other Ambulatory Visit: Payer: Self-pay

## 2015-05-30 ENCOUNTER — Ambulatory Visit
Admission: RE | Admit: 2015-05-30 | Discharge: 2015-05-30 | Disposition: A | Discharge status: Home / Self Care | Payer: BLUE CROSS/BLUE SHIELD | Source: Ambulatory Visit | Attending: Internal Medicine | Admitting: Internal Medicine

## 2015-05-30 DIAGNOSIS — R1011 Right upper quadrant pain: Secondary | ICD-10-CM

## 2015-08-01 ENCOUNTER — Other Ambulatory Visit: Payer: Self-pay | Admitting: Physician Assistant

## 2015-08-01 DIAGNOSIS — J029 Acute pharyngitis, unspecified: Secondary | ICD-10-CM

## 2015-08-01 DIAGNOSIS — K219 Gastro-esophageal reflux disease without esophagitis: Secondary | ICD-10-CM

## 2015-08-01 DIAGNOSIS — R1310 Dysphagia, unspecified: Secondary | ICD-10-CM

## 2015-08-06 ENCOUNTER — Ambulatory Visit
Admission: RE | Admit: 2015-08-06 | Discharge: 2015-08-06 | Disposition: A | Discharge status: Home / Self Care | Payer: BLUE CROSS/BLUE SHIELD | Source: Ambulatory Visit | Attending: Physician Assistant | Admitting: Physician Assistant

## 2015-08-06 DIAGNOSIS — R1310 Dysphagia, unspecified: Secondary | ICD-10-CM

## 2015-08-06 DIAGNOSIS — K219 Gastro-esophageal reflux disease without esophagitis: Secondary | ICD-10-CM

## 2015-08-06 DIAGNOSIS — J029 Acute pharyngitis, unspecified: Secondary | ICD-10-CM

## 2017-03-23 ENCOUNTER — Encounter (HOSPITAL_COMMUNITY): Payer: Self-pay
